# Patient Record
Sex: Male | Born: 1950 | Race: White | Hispanic: No | Marital: Married
Health system: Southern US, Community
[De-identification: ages and names within clinical notes are randomized; demographics above are authoritative.]

## PROBLEM LIST (undated history)

## (undated) DIAGNOSIS — E119 Type 2 diabetes mellitus without complications: Secondary | ICD-10-CM

## (undated) DIAGNOSIS — I10 Essential (primary) hypertension: Secondary | ICD-10-CM

## (undated) HISTORY — PX: OTHER SURGICAL HISTORY: SHX169

---

## 2022-03-22 ENCOUNTER — Emergency Department (HOSPITAL_COMMUNITY): Payer: Medicare Other

## 2022-03-22 ENCOUNTER — Emergency Department (HOSPITAL_COMMUNITY)
Admission: EM | Admit: 2022-03-22 | Discharge: 2022-03-22 | Disposition: A | Discharge status: Home / Self Care | Payer: Medicare Other | Attending: Emergency Medicine | Admitting: Emergency Medicine

## 2022-03-22 ENCOUNTER — Encounter (HOSPITAL_COMMUNITY): Payer: Self-pay | Admitting: Pharmacy Technician

## 2022-03-22 ENCOUNTER — Other Ambulatory Visit: Payer: Self-pay

## 2022-03-22 DIAGNOSIS — R739 Hyperglycemia, unspecified: Secondary | ICD-10-CM | POA: Diagnosis not present

## 2022-03-22 DIAGNOSIS — I6789 Other cerebrovascular disease: Secondary | ICD-10-CM | POA: Diagnosis not present

## 2022-03-22 DIAGNOSIS — I10 Essential (primary) hypertension: Secondary | ICD-10-CM | POA: Insufficient documentation

## 2022-03-22 DIAGNOSIS — R519 Headache, unspecified: Secondary | ICD-10-CM | POA: Diagnosis present

## 2022-03-22 DIAGNOSIS — H532 Diplopia: Secondary | ICD-10-CM | POA: Diagnosis present

## 2022-03-22 DIAGNOSIS — R253 Fasciculation: Secondary | ICD-10-CM | POA: Diagnosis present

## 2022-03-22 HISTORY — DX: Essential (primary) hypertension: I10

## 2022-03-22 HISTORY — DX: Type 2 diabetes mellitus without complications: E11.9

## 2022-03-22 LAB — CBC WITH DIFFERENTIAL/PLATELET
Abs Immature Granulocytes: 0.03 10*3/uL (ref 0.00–0.07)
Basophils Absolute: 0.1 10*3/uL (ref 0.0–0.1)
Basophils Relative: 1 %
Eosinophils Absolute: 0.2 10*3/uL (ref 0.0–0.5)
Eosinophils Relative: 2 %
HCT: 41.6 % (ref 39.0–52.0)
Hemoglobin: 13.7 g/dL (ref 13.0–17.0)
Immature Granulocytes: 0 %
Lymphocytes Relative: 12 %
Lymphs Abs: 1.3 10*3/uL (ref 0.7–4.0)
MCH: 29.9 pg (ref 26.0–34.0)
MCHC: 32.9 g/dL (ref 30.0–36.0)
MCV: 90.8 fL (ref 80.0–100.0)
Monocytes Absolute: 0.5 10*3/uL (ref 0.1–1.0)
Monocytes Relative: 5 %
Neutro Abs: 8.3 10*3/uL — ABNORMAL HIGH (ref 1.7–7.7)
Neutrophils Relative %: 80 %
Platelets: 174 10*3/uL (ref 150–400)
RBC: 4.58 MIL/uL (ref 4.22–5.81)
RDW: 12.5 % (ref 11.5–15.5)
WBC: 10.3 10*3/uL (ref 4.0–10.5)
nRBC: 0 % (ref 0.0–0.2)

## 2022-03-22 LAB — URINALYSIS, ROUTINE W REFLEX MICROSCOPIC
Bilirubin Urine: NEGATIVE
Glucose, UA: NEGATIVE mg/dL
Hgb urine dipstick: NEGATIVE
Ketones, ur: NEGATIVE mg/dL
Leukocytes,Ua: NEGATIVE
Nitrite: NEGATIVE
Protein, ur: >=300 mg/dL — AB
Specific Gravity, Urine: 1.015 (ref 1.005–1.030)
pH: 5 (ref 5.0–8.0)

## 2022-03-22 LAB — COMPREHENSIVE METABOLIC PANEL
ALT: 14 U/L (ref 0–44)
AST: 17 U/L (ref 15–41)
Albumin: 3.5 g/dL (ref 3.5–5.0)
Alkaline Phosphatase: 80 U/L (ref 38–126)
Anion gap: 10 (ref 5–15)
BUN: 16 mg/dL (ref 8–23)
CO2: 21 mmol/L — ABNORMAL LOW (ref 22–32)
Calcium: 9 mg/dL (ref 8.9–10.3)
Chloride: 111 mmol/L (ref 98–111)
Creatinine, Ser: 1.91 mg/dL — ABNORMAL HIGH (ref 0.61–1.24)
GFR, Estimated: 37 mL/min — ABNORMAL LOW (ref >60)
Glucose, Bld: 121 mg/dL — ABNORMAL HIGH (ref 70–99)
Potassium: 3.8 mmol/L (ref 3.5–5.1)
Sodium: 142 mmol/L (ref 135–145)
Total Bilirubin: 0.7 mg/dL (ref 0.3–1.2)
Total Protein: 6.6 g/dL (ref 6.5–8.1)

## 2022-03-22 IMAGING — MR MR MRA HEAD W/O CM
12 of 14 series · 35 of 48 positions shown · non-contrast
Comparison: CT head [DATE]

CLINICAL DATA: TIA. Bilateral eye twitching. Headache and
hypertension

EXAM:
MRI HEAD WITHOUT CONTRAST
MRA HEAD WITHOUT CONTRAST
TECHNIQUE: Multiplanar, multi-echo pulse sequences of the brain and surrounding
structures were acquired without intravenous contrast. Angiographic
images of the Circle of Willis were acquired using MRA technique
without intravenous contrast.

[Series 5: DWI · axial · 3.0mm · 0.88mm/px · z∈[-26,+110]mm · 6 of 100 slices shown (1 of 4)]
[im 1/100]
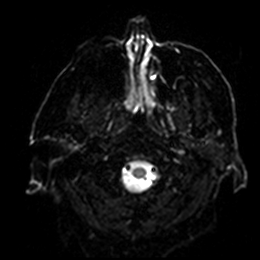
[im 20/100]
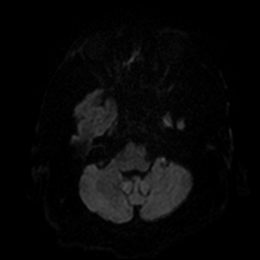
[im 40/100]
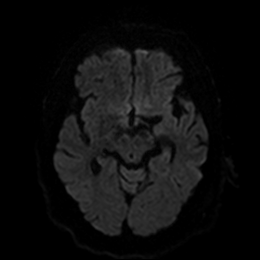
[im 60/100]
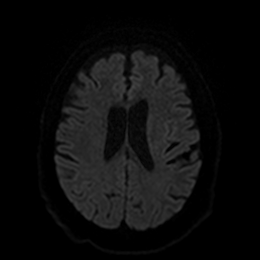
[im 80/100]
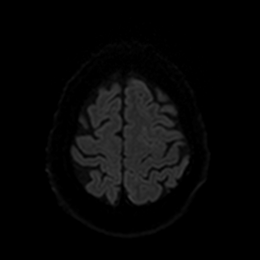
[im 100/100]
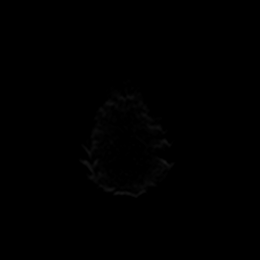

[Series 6: DWI · axial · 3.0mm · 0.88mm/px · z∈[-26,+110]mm · 3 of 50 slices shown (2 of 4)]
[im 1/50]
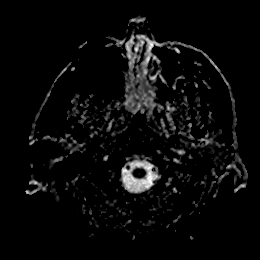
[im 25/50]
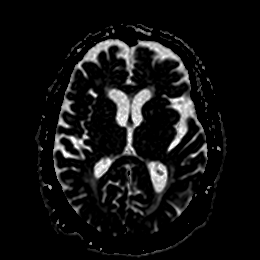
[im 50/50]
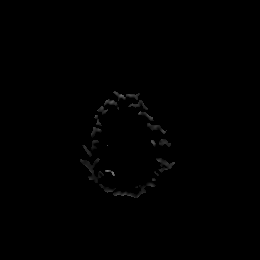

[Series 7: DWI · coronal · 4.0mm · 0.88mm/px · 4 of 72 slices shown (3 of 4)]
[im 1/72]
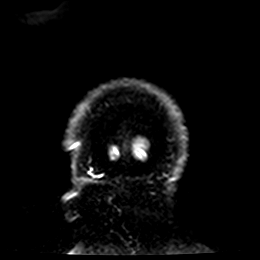
[im 24/72]
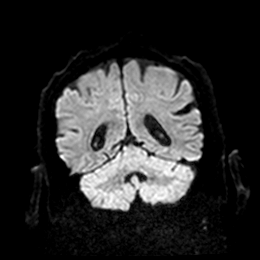
[im 48/72]
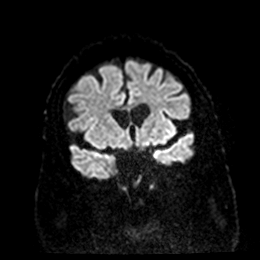
[im 72/72]
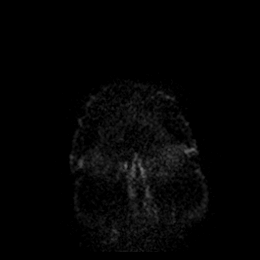

[Series 8: DWI · coronal · 4.0mm · 0.88mm/px · 2 of 36 slices shown (4 of 4)]
[im 1/36]
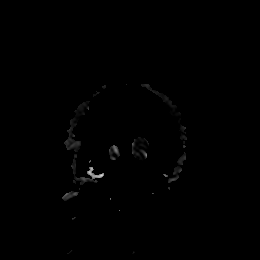
[im 36/36]
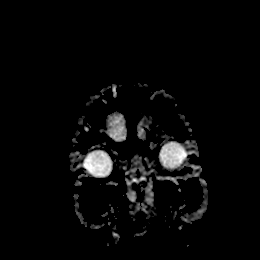

[Series 13: FLAIR · axial · 5.0mm · 0.45mm/px · z∈[-31,+114]mm · 2 of 27 slices shown]
[im 1/27]
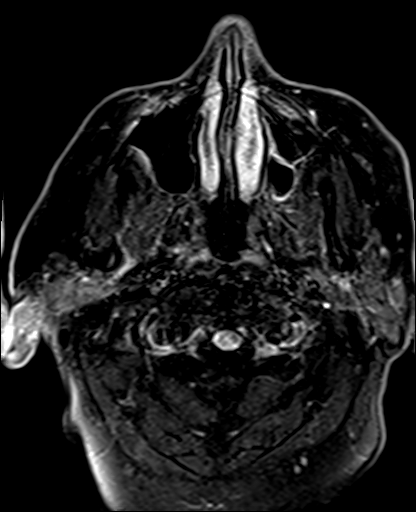
[im 27/27]
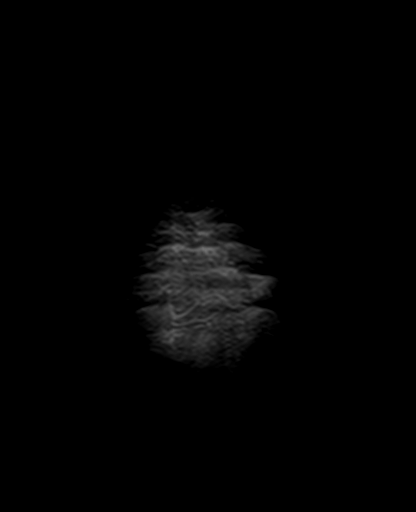

[Series 14: mag_images · axial · 3.0mm · 0.90mm/px · z∈[-35,+118]mm · 3 of 56 slices shown]
[im 1/56]
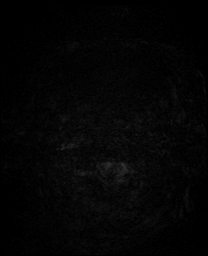
[im 28/56]
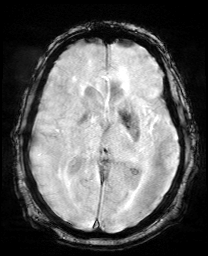
[im 56/56]
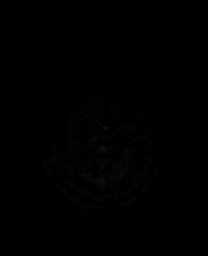

[Series 15: pha_images · axial · 3.0mm · 0.90mm/px · z∈[-29,+115]mm · 3 of 53 slices shown]
[im 1/53]
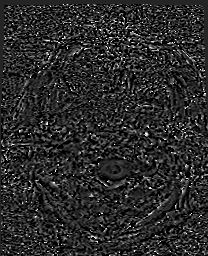
[im 27/53]
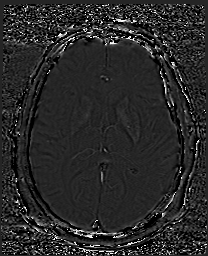
[im 53/53]
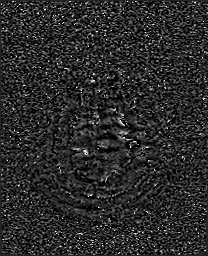

[Series 16: swi_images · axial · 3.0mm · 0.90mm/px · z∈[-35,+118]mm · 3 of 56 slices shown]
[im 1/56]
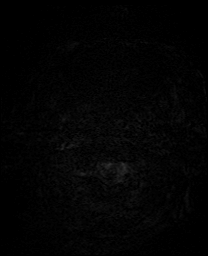
[im 28/56]
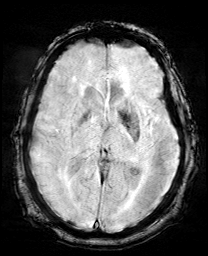
[im 56/56]
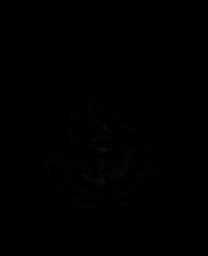

[Series 17: mip_images(sw) · axial · 24.0mm · 0.90mm/px · z∈[-25,+109]mm · 3 of 49 slices shown]
[im 1/49]
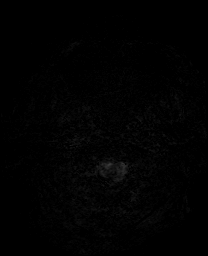
[im 25/49]
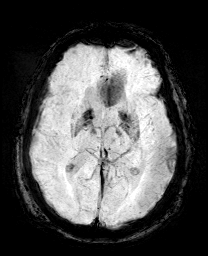
[im 49/49]
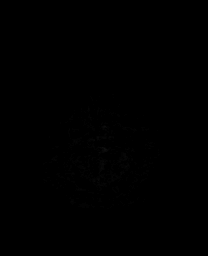

[Series 18: T1 · sagittal · 5.0mm · 0.75mm/px · 2 of 26 slices shown]
[im 1/26]
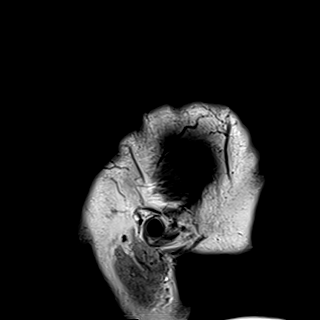
[im 26/26]
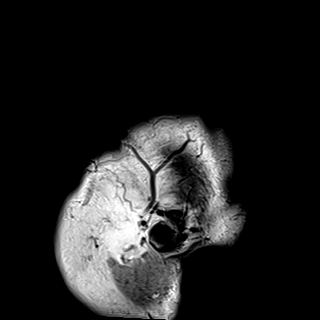

[Series 19: T2 · axial · 5.0mm · 0.72mm/px · z∈[-31,+114]mm · 2 of 27 slices shown (1 of 2)]
[im 1/27]
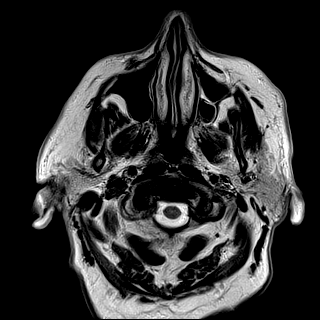
[im 27/27]
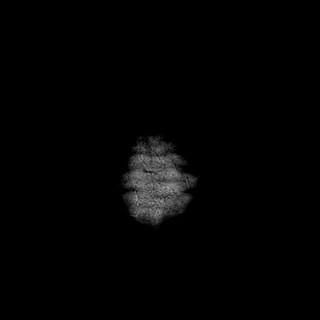

[Series 21: T2 · coronal · 5.0mm · 0.34mm/px · 2 of 31 slices shown (2 of 2)]
[im 1/31]
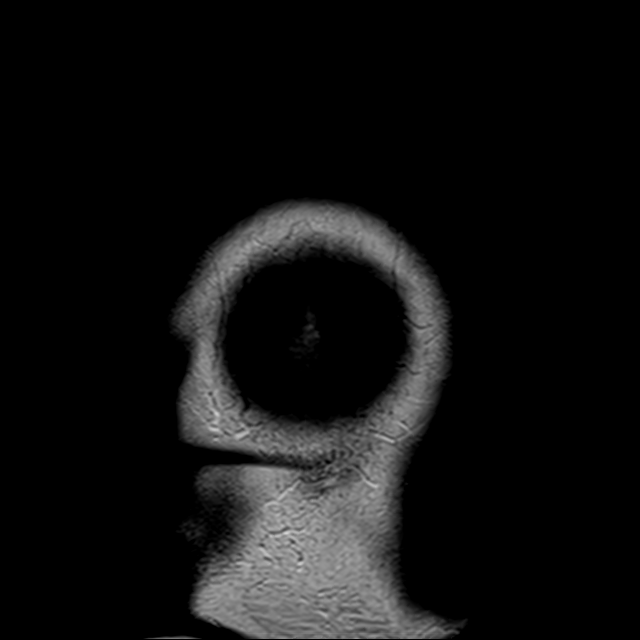
[im 31/31]
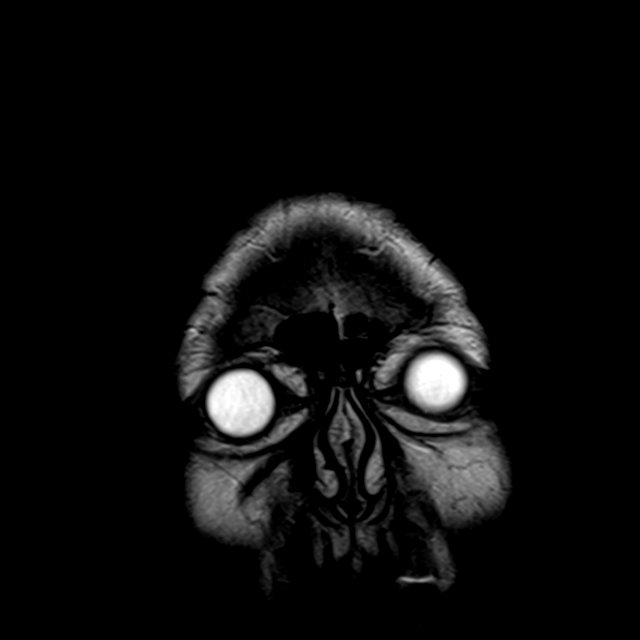

[35 of 48 positions shown; findings below may reference images not displayed]

FINDINGS: MRI HEAD FINDINGS

Brain: Negative for acute infarct.  Negative for hemorrhage or mass.

Mild cerebral atrophy. Moderate white matter changes with patchy
periventricular and deep white matter hyperintensity bilaterally.

Vascular: Normal arterial flow voids

Skull and upper cervical spine: No focal skeletal lesion.

Sinuses/Orbits: Mild mucosal edema paranasal sinuses. Bilateral
cataract extraction

Other: None

MRA HEAD FINDINGS

Anterior circulation: Mild atherosclerotic irregularity in the
cavernous carotid bilaterally without stenosis. Anterior and middle
cerebral arteries patent. Azygous anterior cerebral artery, normal
variant. No aneurysm

Posterior circulation: Both vertebral arteries widely patent to the
basilar. Left PICA patent. Right PICA small. Right AICA noted.
Basilar patent. Superior cerebellar artery and posterior cerebral
arteries patent. No large vessel occlusion or aneurysm.

Anatomic variants: None
IMPRESSION: 1. Negative for acute infarct. Atrophy and chronic microvascular
ischemic change
2. Negative MRA head

## 2022-03-22 IMAGING — MR MR HEAD W/O CM
12 of 13 series · 44 of 48 positions shown · non-contrast
Comparison: CT head [DATE]

CLINICAL DATA: TIA. Bilateral eye twitching. Headache and
hypertension

EXAM:
MRI HEAD WITHOUT CONTRAST
MRA HEAD WITHOUT CONTRAST
TECHNIQUE: Multiplanar, multi-echo pulse sequences of the brain and surrounding
structures were acquired without intravenous contrast. Angiographic
images of the Circle of Willis were acquired using MRA technique
without intravenous contrast.

[Series 5: DWI · axial · 3.0mm · 0.88mm/px · z∈[-26,+110]mm · 8 of 100 slices shown (1 of 4)]
[im 1/100]
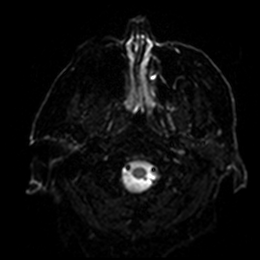
[im 15/100]
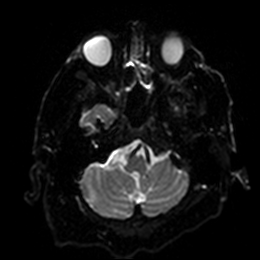
[im 29/100]
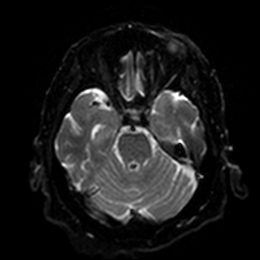
[im 43/100]
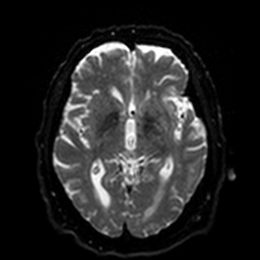
[im 57/100]
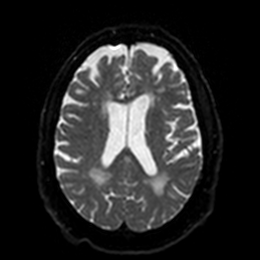
[im 71/100]
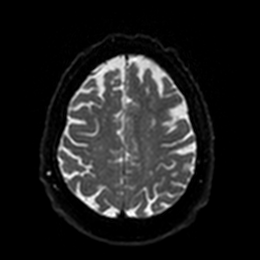
[im 85/100]
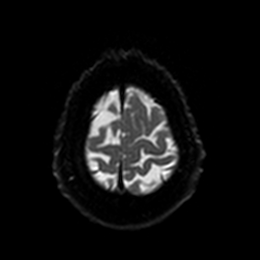
[im 100/100]
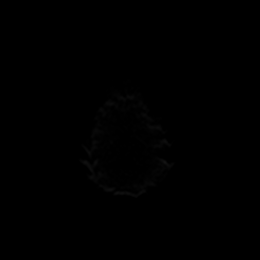

[Series 6: DWI · axial · 3.0mm · 0.88mm/px · z∈[-26,+110]mm · 4 of 50 slices shown (2 of 4)]
[im 1/50]
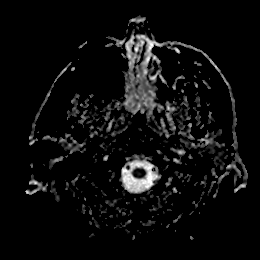
[im 17/50]
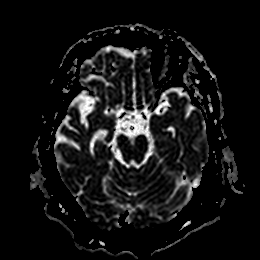
[im 33/50]
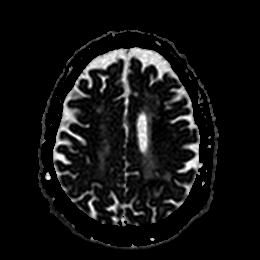
[im 50/50]
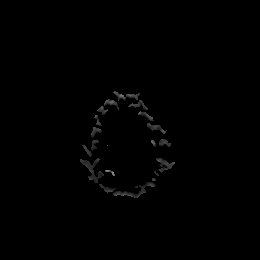

[Series 7: DWI · coronal · 4.0mm · 0.88mm/px · 5 of 72 slices shown (3 of 4)]
[im 1/72]
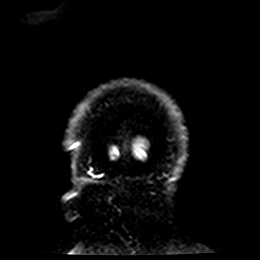
[im 18/72]
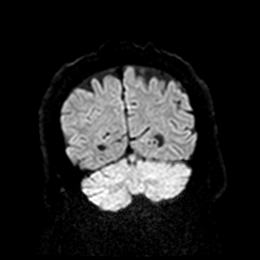
[im 36/72]
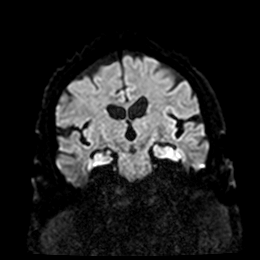
[im 54/72]
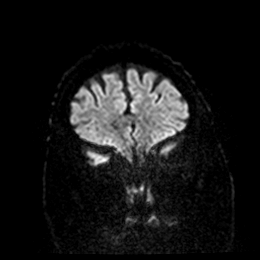
[im 72/72]
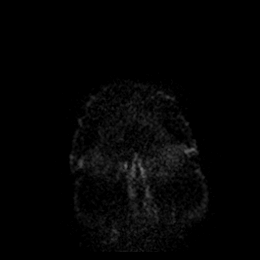

[Series 8: DWI · coronal · 4.0mm · 0.88mm/px · 3 of 36 slices shown (4 of 4)]
[im 1/36]
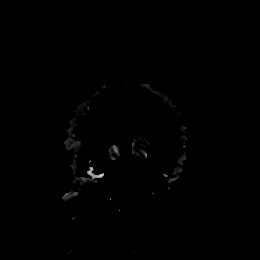
[im 18/36]
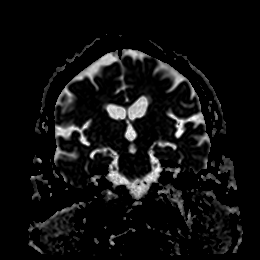
[im 36/36]
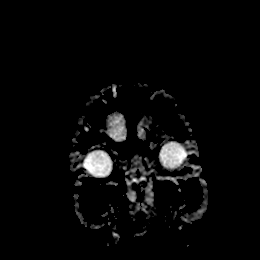

[Series 9: FLAIR · axial · 5.0mm · 0.45mm/px · z∈[-31,+114]mm · 2 of 27 slices shown]
[im 1/27]
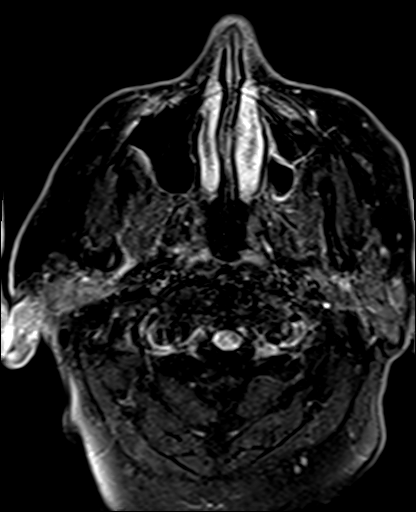
[im 27/27]
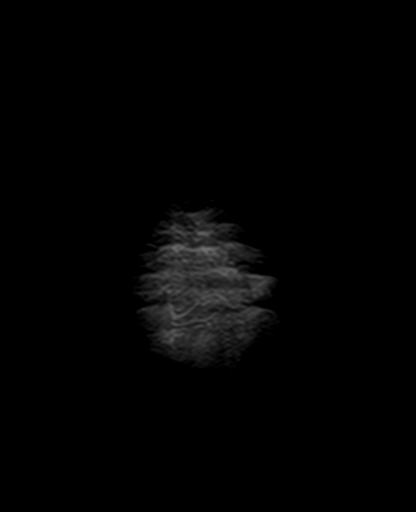

[Series 10: mag_images · axial · 3.0mm · 0.90mm/px · z∈[-35,+118]mm · 4 of 56 slices shown]
[im 1/56]
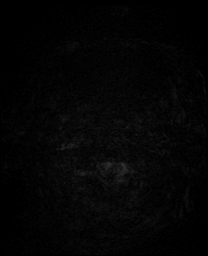
[im 19/56]
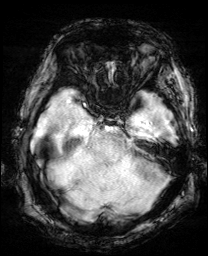
[im 37/56]
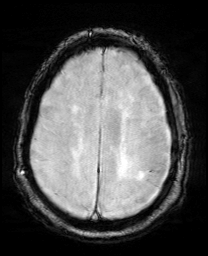
[im 56/56]
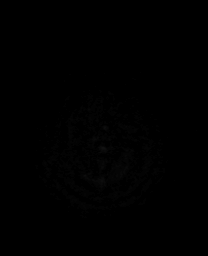

[Series 11: pha_images · axial · 3.0mm · 0.90mm/px · z∈[-29,+115]mm · 4 of 53 slices shown]
[im 1/53]
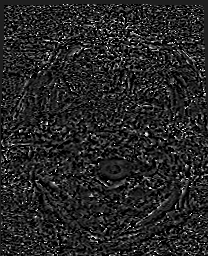
[im 18/53]
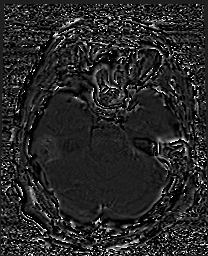
[im 35/53]
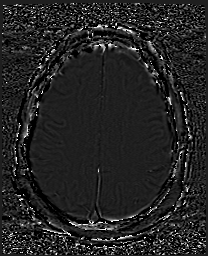
[im 53/53]
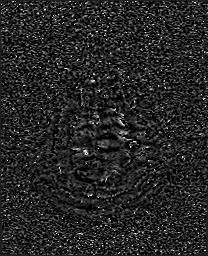

[Series 12: swi_images · axial · 3.0mm · 0.90mm/px · z∈[-35,+118]mm · 4 of 56 slices shown]
[im 1/56]
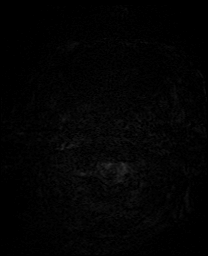
[im 19/56]
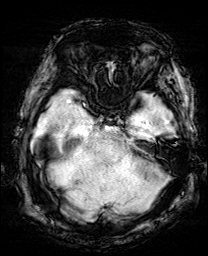
[im 37/56]
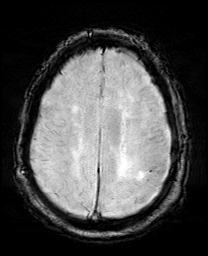
[im 56/56]
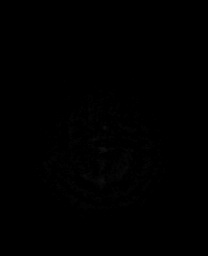

[Series 13: mip_images(sw) · axial · 24.0mm · 0.90mm/px · z∈[-25,+109]mm · 4 of 49 slices shown]
[im 1/49]
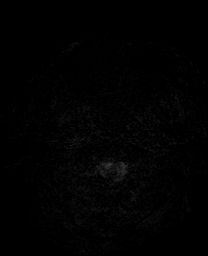
[im 17/49]
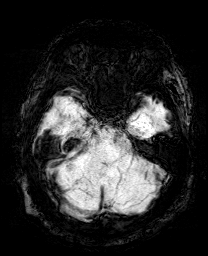
[im 33/49]
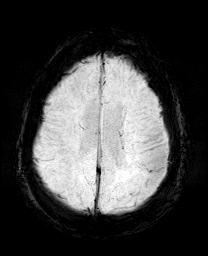
[im 49/49]
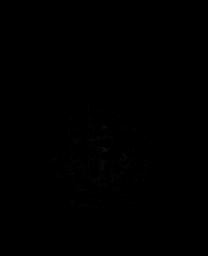

[Series 14: T1 · sagittal · 5.0mm · 0.75mm/px · 2 of 26 slices shown]
[im 1/26]
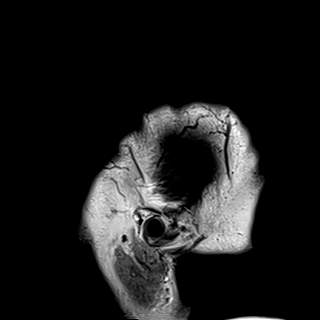
[im 26/26]
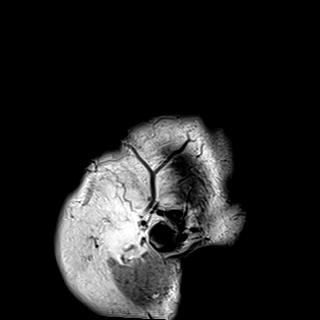

[Series 15: T2 · axial · 5.0mm · 0.72mm/px · z∈[-31,+114]mm · 2 of 27 slices shown (1 of 2)]
[im 1/27]
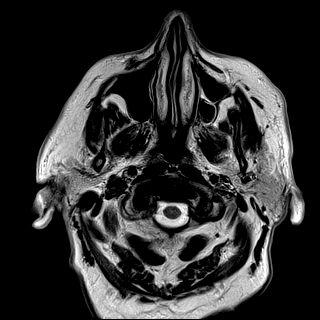
[im 27/27]
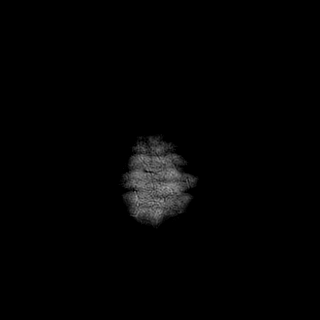

[Series 17: T2 · coronal · 5.0mm · 0.34mm/px · 2 of 31 slices shown (2 of 2)]
[im 1/31]
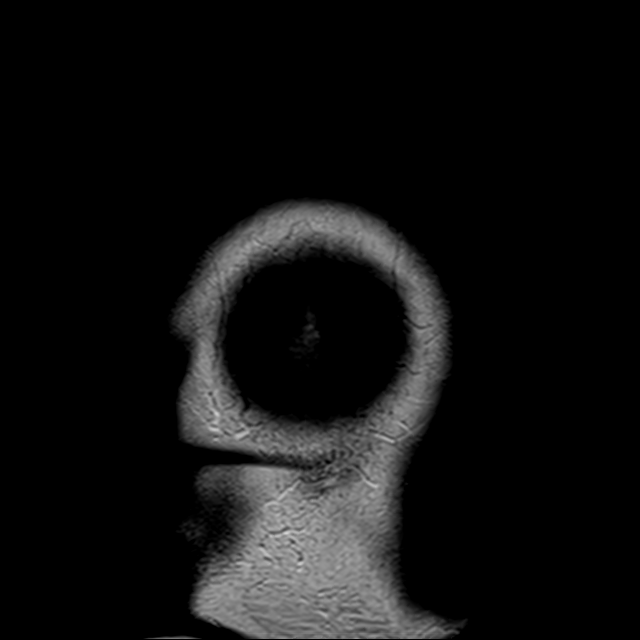
[im 31/31]
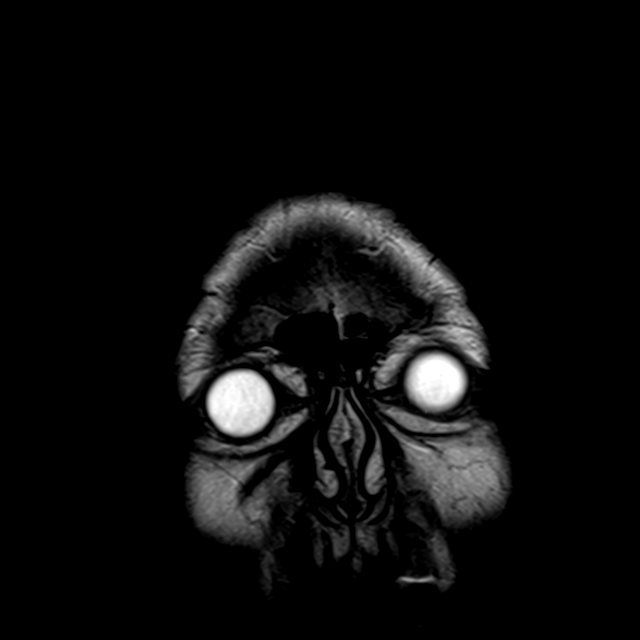

[44 of 48 positions shown; findings below may reference images not displayed]

FINDINGS: MRI HEAD FINDINGS

Brain: Negative for acute infarct.  Negative for hemorrhage or mass.

Mild cerebral atrophy. Moderate white matter changes with patchy
periventricular and deep white matter hyperintensity bilaterally.

Vascular: Normal arterial flow voids

Skull and upper cervical spine: No focal skeletal lesion.

Sinuses/Orbits: Mild mucosal edema paranasal sinuses. Bilateral
cataract extraction

Other: None

MRA HEAD FINDINGS

Anterior circulation: Mild atherosclerotic irregularity in the
cavernous carotid bilaterally without stenosis. Anterior and middle
cerebral arteries patent. Azygous anterior cerebral artery, normal
variant. No aneurysm

Posterior circulation: Both vertebral arteries widely patent to the
basilar. Left PICA patent. Right PICA small. Right AICA noted.
Basilar patent. Superior cerebellar artery and posterior cerebral
arteries patent. No large vessel occlusion or aneurysm.

Anatomic variants: None
IMPRESSION: 1. Negative for acute infarct. Atrophy and chronic microvascular
ischemic change
2. Negative MRA head

## 2022-03-22 MED ORDER — METOCLOPRAMIDE HCL 5 MG/ML IJ SOLN: 10.0000 mg | Freq: Once | INTRAMUSCULAR | Status: DC

## 2022-03-22 MED ORDER — CLONIDINE HCL 0.2 MG PO TABS: 0.2000 mg | ORAL_TABLET | Freq: Two times a day (BID) | ORAL | 0 refills | Status: AC | PRN

## 2022-03-22 MED ORDER — CLONIDINE HCL 0.1 MG PO TABS
0.3000 mg | ORAL_TABLET | Freq: Once | ORAL | Status: AC
  Administered 2022-03-22: 0.3 mg via ORAL
  Filled 2022-03-22: qty 1

## 2022-03-22 MED ORDER — DIAZEPAM 5 MG/ML IJ SOLN
5.0000 mg | Freq: Once | INTRAMUSCULAR | Status: AC | PRN
Start: 2022-03-22 — End: 2022-03-22
  Administered 2022-03-22: 5 mg via INTRAVENOUS
  Filled 2022-03-22: qty 2

## 2022-03-22 MED ORDER — DIPHENHYDRAMINE HCL 50 MG/ML IJ SOLN: 25.0000 mg | Freq: Once | INTRAMUSCULAR | Status: DC

## 2022-03-22 NOTE — ED Notes (Signed)
Pt transported to MRI 
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
## 2022-03-22 NOTE — ED Triage Notes (Signed)
Pt here with reports of blurry vision and dizziness onset approx 1300 today. Pt endorses headache behind R eye. ?
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
## 2022-03-22 NOTE — Discharge Instructions (Addendum)
You were evaluated in the Emergency Department and after careful evaluation, we did not find any emergent condition requiring admission or further testing in the hospital. ? ?Your exam/testing today was overall reassuring.  Your MRI and CT scan did not show any acute changes.  We will prescribe a short course of clonidine as needed for elevated blood pressures. ? ?Please return to the Emergency Department if you experience any worsening of your condition.  Thank you for allowing [place] to be a part of your care.  ?

## 2025-01-31 DEATH — deceased
